# Patient Record
Sex: Male | Born: 1945 | Race: White | Hispanic: No | Marital: Married | Smoking: Former smoker
Health system: Southern US, Community
[De-identification: ages and names within clinical notes are randomized; demographics above are authoritative.]

## PROBLEM LIST (undated history)

## (undated) DIAGNOSIS — C439 Malignant melanoma of skin, unspecified: Secondary | ICD-10-CM

## (undated) HISTORY — PX: HERNIA REPAIR: SHX51

## (undated) HISTORY — PX: TONSILLECTOMY: SUR1361

## (undated) HISTORY — PX: OTHER SURGICAL HISTORY: SHX169

---

## 2008-02-22 ENCOUNTER — Emergency Department (HOSPITAL_BASED_OUTPATIENT_CLINIC_OR_DEPARTMENT_OTHER): Admission: EM | Admit: 2008-02-22 | Discharge: 2008-02-22 | Payer: Self-pay | Admitting: Emergency Medicine

## 2008-02-22 ENCOUNTER — Ambulatory Visit: Payer: Self-pay | Admitting: Diagnostic Radiology

## 2010-12-16 LAB — DIFFERENTIAL
Eosinophils Relative: 3 % (ref 0–5)
Lymphocytes Relative: 18 % (ref 12–46)
Lymphs Abs: 1.5 10*3/uL (ref 0.7–4.0)
Monocytes Absolute: 0.4 10*3/uL (ref 0.1–1.0)
Monocytes Relative: 5 % (ref 3–12)

## 2010-12-16 LAB — BASIC METABOLIC PANEL
GFR calc non Af Amer: >60 mL/min (ref >60)
Glucose, Bld: 102 mg/dL — ABNORMAL HIGH (ref 70–99)
Potassium: 4.5 mEq/L (ref 3.5–5.1)
Sodium: 142 mEq/L (ref 135–145)

## 2010-12-16 LAB — CBC
HCT: 42.2 % (ref 39.0–52.0)
Hemoglobin: 14.5 g/dL (ref 13.0–17.0)
RDW: 11.9 % (ref 11.5–15.5)
WBC: 8.3 10*3/uL (ref 4.0–10.5)

## 2015-02-21 ENCOUNTER — Emergency Department (HOSPITAL_BASED_OUTPATIENT_CLINIC_OR_DEPARTMENT_OTHER)
Admission: EM | Admit: 2015-02-21 | Discharge: 2015-02-21 | Disposition: A | Discharge status: Home / Self Care | Payer: Managed Care, Other (non HMO) | Attending: Emergency Medicine | Admitting: Emergency Medicine

## 2015-02-21 ENCOUNTER — Encounter (HOSPITAL_BASED_OUTPATIENT_CLINIC_OR_DEPARTMENT_OTHER): Payer: Self-pay | Admitting: *Deleted

## 2015-02-21 DIAGNOSIS — T65891A Toxic effect of other specified substances, accidental (unintentional), initial encounter: Secondary | ICD-10-CM | POA: Diagnosis not present

## 2015-02-21 DIAGNOSIS — Z7952 Long term (current) use of systemic steroids: Secondary | ICD-10-CM | POA: Diagnosis not present

## 2015-02-21 DIAGNOSIS — Z87891 Personal history of nicotine dependence: Secondary | ICD-10-CM | POA: Insufficient documentation

## 2015-02-21 DIAGNOSIS — R000 Tachycardia, unspecified: Secondary | ICD-10-CM | POA: Insufficient documentation

## 2015-02-21 DIAGNOSIS — Z85820 Personal history of malignant melanoma of skin: Secondary | ICD-10-CM | POA: Insufficient documentation

## 2015-02-21 DIAGNOSIS — R197 Diarrhea, unspecified: Secondary | ICD-10-CM | POA: Diagnosis present

## 2015-02-21 DIAGNOSIS — T451X5A Adverse effect of antineoplastic and immunosuppressive drugs, initial encounter: Secondary | ICD-10-CM | POA: Insufficient documentation

## 2015-02-21 DIAGNOSIS — R531 Weakness: Secondary | ICD-10-CM | POA: Insufficient documentation

## 2015-02-21 DIAGNOSIS — K521 Toxic gastroenteritis and colitis: Secondary | ICD-10-CM | POA: Diagnosis not present

## 2015-02-21 DIAGNOSIS — Y9389 Activity, other specified: Secondary | ICD-10-CM | POA: Insufficient documentation

## 2015-02-21 DIAGNOSIS — Y9289 Other specified places as the place of occurrence of the external cause: Secondary | ICD-10-CM | POA: Insufficient documentation

## 2015-02-21 DIAGNOSIS — Y998 Other external cause status: Secondary | ICD-10-CM | POA: Diagnosis not present

## 2015-02-21 DIAGNOSIS — X58XXXA Exposure to other specified factors, initial encounter: Secondary | ICD-10-CM | POA: Insufficient documentation

## 2015-02-21 HISTORY — DX: Malignant melanoma of skin, unspecified: C43.9

## 2015-02-21 LAB — BASIC METABOLIC PANEL
ANION GAP: 10 (ref 5–15)
BUN: 20 mg/dL (ref 6–20)
CALCIUM: 8.1 mg/dL — AB (ref 8.9–10.3)
CO2: 21 mmol/L — ABNORMAL LOW (ref 22–32)
Chloride: 101 mmol/L (ref 101–111)
Creatinine, Ser: 0.86 mg/dL (ref 0.61–1.24)
Glucose, Bld: 148 mg/dL — ABNORMAL HIGH (ref 65–99)
Potassium: 3 mmol/L — ABNORMAL LOW (ref 3.5–5.1)
Sodium: 132 mmol/L — ABNORMAL LOW (ref 135–145)

## 2015-02-21 MED ORDER — SODIUM CHLORIDE 0.9 % IV BOLUS (SEPSIS)
2000.0000 mL | Freq: Once | INTRAVENOUS | Status: AC
  Administered 2015-02-21: 2000 mL via INTRAVENOUS

## 2015-02-21 MED ORDER — POTASSIUM CHLORIDE CRYS ER 20 MEQ PO TBCR
20.0000 meq | EXTENDED_RELEASE_TABLET | Freq: Every day | ORAL | Status: AC
Start: 2015-02-21 — End: ?

## 2015-02-21 MED ORDER — POTASSIUM CHLORIDE CRYS ER 20 MEQ PO TBCR
40.0000 meq | EXTENDED_RELEASE_TABLET | Freq: Once | ORAL | Status: AC
  Administered 2015-02-21: 40 meq via ORAL

## 2015-02-21 MED ORDER — POTASSIUM CHLORIDE CRYS ER 20 MEQ PO TBCR
EXTENDED_RELEASE_TABLET | ORAL | Status: AC
  Filled 2015-02-21: qty 2

## 2015-02-21 NOTE — ED Notes (Signed)
MD at bedside. 

## 2015-02-21 NOTE — Discharge Instructions (Signed)
Make sure that you drink at least six eight ounce glasses of water each day. Avoid sugary drinks or foods. Your blood sugar was mildly elevated at 148. You may be borderline diabetic. Asked your primary care physician to order a test known as hemoglobin A1c to check you for diabetes. Prednisone can also cause your blood sugar to be high. Blood potassium was low today at 3.0. Take the potassium supplement prescribed. Ask your physician to recheck your blood potassium within a week. Return if you feel worse for any reason or see your doctor.

## 2015-02-21 NOTE — ED Provider Notes (Addendum)
CSN: [id number]     Arrival date & time 02/21/15  1007 History   First MD Initiated Contact with Patient 02/21/15 1017     Chief Complaint  Patient presents with  . Diarrhea     (Consider location/radiation/quality/duration/timing/severity/associated sxs/prior Treatment) HPI Complains of diarrhea, nonbloody onset approximately 2 weeks ago, since being on  agents ipilmumab and nivolumab as treatment for melanoma. Patient's oncologist has told him that diarrhea is a known side effect will last for several weeks. He has been treated with intravenous fluids twice in the past week. His oncologist told him to come to the emergency department to receive IV fluids however should receive no other medications including antidiarrheal agents. Patient denies fever. Other associated symptoms include generalized weakness and lightheadedness with standing, and thirst. He also admits to diminished appetite over the past several weeks. Denies nausea or vomiting No fever. No blood per rectum. No abdominal pain. Past Medical History  Diagnosis Date  . Melanoma [facility])    Past Surgical History  Procedure Laterality Date  . Tonsillectomy    . Excision for melanoma    . Hernia repair     No family history on file. Social History  Substance Use Topics  . Smoking status: Former Research scientist (life sciences)  . Smokeless tobacco: Never Used  . Alcohol Use: Yes     Comment: rare    Review of Systems  Constitutional: Positive for appetite change.  HENT: Negative.   Respiratory: Negative.   Cardiovascular: Negative.   Gastrointestinal: Positive for diarrhea.  Musculoskeletal: Negative.   Skin: Negative.   Allergic/Immunologic: Positive for immunocompromised state.       Cancer patient  Neurological: Positive for weakness.  Psychiatric/Behavioral: Negative.   All other systems reviewed and are negative.     Allergies  Review of patient's allergies indicates no known allergies.  Home Medications   Prior to Admission  medications   Medication Sig Start Date End Date Taking? Authorizing Provider  IPILIMUMAB IV Inject into the vein.   Yes Historical Provider, MD  NIVOLUMAB IV Inject into the vein.   Yes Historical Provider, MD  predniSONE (DELTASONE) 10 MG tablet Take 60 mg by mouth daily with breakfast.   Yes Historical Provider, MD   BP 122/81 mmHg  Pulse 123  Temp(Src) 97.8 F (36.6 C) (Oral)  Resp 18  Ht 5\' 6"  (1.676 m)  Wt 138 lb (62.596 kg)  BMI 22.28 kg/m2  SpO2 99% Physical Exam  Constitutional: He is oriented to person, place, and time. No distress.  Chronically ill-appearing alert and nontoxic  HENT:  Head: Normocephalic and atraumatic.  Mucous membranes dry  Eyes: Conjunctivae are normal. Pupils are equal, round, and reactive to light.  Neck: Neck supple. No tracheal deviation present. No thyromegaly present.  Cardiovascular: Normal rate.   No murmur heard. Tachycardic  Pulmonary/Chest: Effort normal and breath sounds normal.  Abdominal: Soft. Bowel sounds are normal. He exhibits no distension. There is no tenderness.  Musculoskeletal: Normal range of motion. He exhibits no edema or tenderness.  Neurological: He is alert and oriented to person, place, and time. No cranial nerve deficit. Coordination normal.  Skin: Skin is warm and dry. No rash noted.  Psychiatric: He has a normal mood and affect.  Nursing note and vitals reviewed.   ED Course  Procedures (including critical care time) Labs Review Labs Reviewed - No data to display  Imaging Review No results found. I have personally reviewed and evaluated these images and lab results as part of  my medical decision-making.   EKG Interpretation None     12:33pm patient feels much improved and ready to go home after treatment with intravenous fluids Results for orders placed or performed during the hospital encounter of [id number]  Basic metabolic panel  Result Value Ref Range   Sodium 132 (L) 135 - 145 mmol/L   Potassium  3.0 (L) 3.5 - 5.1 mmol/L   Chloride 101 101 - 111 mmol/L   CO2 21 (L) 22 - 32 mmol/L   Glucose, Bld 148 (H) 65 - 99 mg/dL   BUN 20 6 - 20 mg/dL   Creatinine, Ser 0.86 0.61 - 1.24 mg/dL   Calcium 8.1 (L) 8.9 - 10.3 mg/dL   GFR calc non Af Amer >60 >60 mL/min   GFR calc Af Amer >60 >60 mL/min   Anion gap 10 5 - 15   No results found.  MDM  . Diarrhea felt secondary to medications He will Receive potassium chloride 40 mEq orally prior to discharge prescription [facility]. He is encouraged to get his serum potassium rechecked within the next week . Suggest hemoglobin A1c Diagnoses #1 diarrhea #2 mild dehydration #3 hypokalemia #4 hyperglycemia Final diagnoses:  None        Orlie Dakin, MD 02/21/15 1237  Orlie Dakin, MD 02/21/15 1240

## 2015-02-21 NOTE — ED Notes (Signed)
Pt is on a study medication. Has had diarrhea for the last week and a half, worse for 3 days. Pt's physician advised him to come to the ED for IV fluids

## 2017-04-20 ENCOUNTER — Other Ambulatory Visit (HOSPITAL_COMMUNITY): Payer: Managed Care, Other (non HMO)

## 2017-04-20 ENCOUNTER — Inpatient Hospital Stay
Admission: AC | Admit: 2017-04-20 | Discharge: 2017-06-11 | Disposition: E | Payer: Managed Care, Other (non HMO) | Source: Other Acute Inpatient Hospital | Attending: Internal Medicine | Admitting: Internal Medicine

## 2017-04-20 DIAGNOSIS — J9690 Respiratory failure, unspecified, unspecified whether with hypoxia or hypercapnia: Secondary | ICD-10-CM

## 2017-04-20 DIAGNOSIS — Z931 Gastrostomy status: Secondary | ICD-10-CM

## 2017-04-20 DIAGNOSIS — J189 Pneumonia, unspecified organism: Secondary | ICD-10-CM

## 2017-04-20 DIAGNOSIS — Z431 Encounter for attention to gastrostomy: Secondary | ICD-10-CM

## 2017-04-20 DIAGNOSIS — R0603 Acute respiratory distress: Secondary | ICD-10-CM

## 2017-04-20 LAB — BLOOD GAS, ARTERIAL
Acid-Base Excess: 10.3 mmol/L — ABNORMAL HIGH (ref 0.0–2.0)
Bicarbonate: 33.7 mmol/L — ABNORMAL HIGH (ref 20.0–28.0)
FIO2: 30
MECHVT: 400 mL
O2 Saturation: 99.9 %
PATIENT TEMPERATURE: 98.6
PCO2 ART: 39.8 mmHg (ref 32.0–48.0)
PEEP: 5 cmH2O
PH ART: 7.537 — AB (ref 7.350–7.450)
RATE: 20 resp/min
pO2, Arterial: 157 mmHg — ABNORMAL HIGH (ref 83.0–108.0)

## 2017-04-20 MED ORDER — IOPAMIDOL (ISOVUE-300) INJECTION 61%
INTRAVENOUS | Status: AC
  Administered 2017-04-20: 30 mL via GASTROSTOMY
  Filled 2017-04-20: qty 50

## 2017-04-21 ENCOUNTER — Other Ambulatory Visit (HOSPITAL_COMMUNITY): Payer: Managed Care, Other (non HMO)

## 2017-04-21 LAB — CBC WITH DIFFERENTIAL/PLATELET
Basophils Absolute: 0 10*3/uL (ref 0.0–0.1)
Basophils Relative: 0 %
EOS PCT: 1 %
Eosinophils Absolute: 0.1 10*3/uL (ref 0.0–0.7)
HEMATOCRIT: 28 % — AB (ref 39.0–52.0)
Hemoglobin: 8.6 g/dL — ABNORMAL LOW (ref 13.0–17.0)
LYMPHS PCT: 20 %
Lymphs Abs: 1.9 10*3/uL (ref 0.7–4.0)
MCH: 28.3 pg (ref 26.0–34.0)
MCHC: 30.7 g/dL (ref 30.0–36.0)
MCV: 92.1 fL (ref 78.0–100.0)
MONO ABS: 0.9 10*3/uL (ref 0.1–1.0)
MONOS PCT: 10 %
NEUTROS ABS: 6.3 10*3/uL (ref 1.7–7.7)
Neutrophils Relative %: 69 %
Platelets: 436 10*3/uL — ABNORMAL HIGH (ref 150–400)
RBC: 3.04 MIL/uL — ABNORMAL LOW (ref 4.22–5.81)
RDW: 15 % (ref 11.5–15.5)
WBC: 9.3 10*3/uL (ref 4.0–10.5)

## 2017-04-21 LAB — BASIC METABOLIC PANEL
Anion gap: 10 (ref 5–15)
BUN: 17 mg/dL (ref 6–20)
CALCIUM: 8.9 mg/dL (ref 8.9–10.3)
CO2: 32 mmol/L (ref 22–32)
Chloride: 93 mmol/L — ABNORMAL LOW (ref 101–111)
Creatinine, Ser: 0.36 mg/dL — ABNORMAL LOW (ref 0.61–1.24)
GFR calc Af Amer: >60 mL/min (ref >60)
GFR calc non Af Amer: >60 mL/min (ref >60)
GLUCOSE: 132 mg/dL — AB (ref 65–99)
POTASSIUM: 3.7 mmol/L (ref 3.5–5.1)
Sodium: 135 mmol/L (ref 135–145)

## 2017-04-23 MED ORDER — VANCOMYCIN HCL 50 MG/ML PO SOLR
125.00 | ORAL | Status: DC
Start: 2017-04-21 — End: 2017-04-23

## 2017-04-23 MED ORDER — ACETAMINOPHEN 325 MG PO TABS
650.00 | ORAL_TABLET | ORAL | Status: DC
Start: ? — End: 2017-04-23

## 2017-04-23 MED ORDER — POTASSIUM CHLORIDE 20 MEQ/15ML (10%) PO SOLN
ORAL | Status: DC
Start: 2017-04-21 — End: 2017-04-23

## 2017-04-23 MED ORDER — GENERIC EXTERNAL MEDICATION
2.00 g | Status: DC
Start: 2017-04-21 — End: 2017-04-23

## 2017-04-23 MED ORDER — DOCUSATE SODIUM 100 MG PO CAPS
100.00 | ORAL_CAPSULE | ORAL | Status: DC
Start: ? — End: 2017-04-23

## 2017-04-23 MED ORDER — ONDANSETRON 4 MG PO TBDP
4.00 | ORAL_TABLET | ORAL | Status: DC
Start: ? — End: 2017-04-23

## 2017-04-23 MED ORDER — GENERIC EXTERNAL MEDICATION
1.00 | Status: DC
Start: ? — End: 2017-04-23

## 2017-04-23 MED ORDER — DIPHENHYDRAMINE HCL 50 MG PO CAPS
50.00 | ORAL_CAPSULE | ORAL | Status: DC
Start: ? — End: 2017-04-23

## 2017-04-23 MED ORDER — GENERIC EXTERNAL MEDICATION
1.00 | Status: DC
Start: 2017-04-21 — End: 2017-04-23

## 2017-04-23 MED ORDER — CHOLECALCIFEROL 25 MCG (1000 UT) PO TABS
1000.00 | ORAL_TABLET | ORAL | Status: DC
Start: 2017-04-21 — End: 2017-04-23

## 2017-04-23 MED ORDER — IBUPROFEN 100 MG/5ML PO SUSP
400.00 | ORAL | Status: DC
Start: 2017-04-21 — End: 2017-04-23

## 2017-04-23 MED ORDER — HYDRALAZINE HCL 20 MG/ML IJ SOLN
10.00 | INTRAMUSCULAR | Status: DC
Start: ? — End: 2017-04-23

## 2017-04-23 MED ORDER — ACETYLCYSTEINE 20 % IN SOLN
3.00 | RESPIRATORY_TRACT | Status: DC
Start: ? — End: 2017-04-23

## 2017-04-23 MED ORDER — HYDROCODONE-ACETAMINOPHEN 5-325 MG PO TABS
1.00 | ORAL_TABLET | ORAL | Status: DC
Start: ? — End: 2017-04-23

## 2017-04-23 MED ORDER — FENTANYL 25 MCG/HR TD PT72
1.00 | MEDICATED_PATCH | TRANSDERMAL | Status: DC
Start: 2017-04-21 — End: 2017-04-23

## 2017-04-23 MED ORDER — FUROSEMIDE 20 MG PO TABS
20.00 | ORAL_TABLET | ORAL | Status: DC
Start: ? — End: 2017-04-23

## 2017-04-23 MED ORDER — INSULIN LISPRO 100 UNIT/ML ~~LOC~~ SOLN
1.00 | SUBCUTANEOUS | Status: DC
Start: 2017-04-21 — End: 2017-04-23

## 2017-04-23 MED ORDER — GENERIC EXTERNAL MEDICATION
4.00 | Status: DC
Start: 2017-04-21 — End: 2017-04-23

## 2017-04-23 MED ORDER — ENOXAPARIN SODIUM 40 MG/0.4ML ~~LOC~~ SOLN
40.00 | SUBCUTANEOUS | Status: DC
Start: 2017-04-21 — End: 2017-04-23

## 2017-04-23 MED ORDER — SERTRALINE HCL 100 MG PO TABS
100.00 | ORAL_TABLET | ORAL | Status: DC
Start: 2017-04-21 — End: 2017-04-23

## 2017-04-23 MED ORDER — FAMOTIDINE 20 MG PO TABS
40.00 | ORAL_TABLET | ORAL | Status: DC
Start: 2017-04-21 — End: 2017-04-23

## 2017-04-23 MED ORDER — GUAIFENESIN 100 MG/5ML PO SYRP
200.00 | ORAL_SOLUTION | ORAL | Status: DC
Start: ? — End: 2017-04-23

## 2017-04-23 MED ORDER — FERROUS SULFATE 300 (60 FE) MG/5ML PO SYRP
300.00 | ORAL_SOLUTION | ORAL | Status: DC
Start: 2017-04-21 — End: 2017-04-23

## 2017-04-23 MED ORDER — QUETIAPINE FUMARATE 50 MG PO TABS
50.00 | ORAL_TABLET | ORAL | Status: DC
Start: 2017-04-21 — End: 2017-04-23

## 2017-04-23 MED ORDER — DEXTROSE 50 % IV SOLN
12.00 g | INTRAVENOUS | Status: DC
Start: ? — End: 2017-04-23

## 2017-04-23 MED ORDER — ALUMINUM-MAGNESIUM-SIMETHICONE 200-200-20 MG/5ML PO SUSP
30.00 | ORAL | Status: DC
Start: ? — End: 2017-04-23

## 2017-04-23 MED ORDER — GENERIC EXTERNAL MEDICATION
2.00 | Status: DC
Start: 2017-04-21 — End: 2017-04-23

## 2017-04-23 MED ORDER — GLUCOSE 40 % PO GEL
15.00 g | ORAL | Status: DC
Start: ? — End: 2017-04-23

## 2017-04-23 MED ORDER — ALBUTEROL SULFATE (2.5 MG/3ML) 0.083% IN NEBU
2.50 | INHALATION_SOLUTION | RESPIRATORY_TRACT | Status: DC
Start: ? — End: 2017-04-23

## 2017-04-25 LAB — BASIC METABOLIC PANEL
ANION GAP: 10 (ref 5–15)
BUN: 30 mg/dL — ABNORMAL HIGH (ref 6–20)
CALCIUM: 9.1 mg/dL (ref 8.9–10.3)
CO2: 33 mmol/L — ABNORMAL HIGH (ref 22–32)
Chloride: 97 mmol/L — ABNORMAL LOW (ref 101–111)
Creatinine, Ser: 0.44 mg/dL — ABNORMAL LOW (ref 0.61–1.24)
GFR calc non Af Amer: >60 mL/min (ref >60)
Glucose, Bld: 133 mg/dL — ABNORMAL HIGH (ref 65–99)
POTASSIUM: 4 mmol/L (ref 3.5–5.1)
Sodium: 140 mmol/L (ref 135–145)

## 2017-04-25 LAB — CBC WITH DIFFERENTIAL/PLATELET
Basophils Absolute: 0 10*3/uL (ref 0.0–0.1)
Basophils Relative: 0 %
Eosinophils Absolute: 0.1 10*3/uL (ref 0.0–0.7)
Eosinophils Relative: 1 %
HCT: 26.5 % — ABNORMAL LOW (ref 39.0–52.0)
Hemoglobin: 8.2 g/dL — ABNORMAL LOW (ref 13.0–17.0)
Lymphocytes Relative: 19 %
Lymphs Abs: 2 10*3/uL (ref 0.7–4.0)
MCH: 29 pg (ref 26.0–34.0)
MCHC: 30.9 g/dL (ref 30.0–36.0)
MCV: 93.6 fL (ref 78.0–100.0)
Monocytes Absolute: 0.9 10*3/uL (ref 0.1–1.0)
Monocytes Relative: 9 %
Neutro Abs: 7.5 10*3/uL (ref 1.7–7.7)
Neutrophils Relative %: 71 %
Platelets: 423 10*3/uL — ABNORMAL HIGH (ref 150–400)
RBC: 2.83 MIL/uL — ABNORMAL LOW (ref 4.22–5.81)
RDW: 14.9 % (ref 11.5–15.5)
WBC: 10.5 10*3/uL (ref 4.0–10.5)

## 2017-05-01 ENCOUNTER — Other Ambulatory Visit (HOSPITAL_COMMUNITY): Payer: Managed Care, Other (non HMO)

## 2017-05-01 LAB — BASIC METABOLIC PANEL
Anion gap: 15 (ref 5–15)
BUN: 37 mg/dL — ABNORMAL HIGH (ref 6–20)
CHLORIDE: 109 mmol/L (ref 101–111)
CO2: 26 mmol/L (ref 22–32)
CREATININE: 0.49 mg/dL — AB (ref 0.61–1.24)
Calcium: 9.7 mg/dL (ref 8.9–10.3)
GFR calc non Af Amer: >60 mL/min (ref >60)
GLUCOSE: 177 mg/dL — AB (ref 65–99)
Potassium: 4.1 mmol/L (ref 3.5–5.1)
Sodium: 150 mmol/L — ABNORMAL HIGH (ref 135–145)

## 2017-05-01 LAB — CBC
HCT: 32.5 % — ABNORMAL LOW (ref 39.0–52.0)
Hemoglobin: 9.5 g/dL — ABNORMAL LOW (ref 13.0–17.0)
MCH: 27.2 pg (ref 26.0–34.0)
MCHC: 29.2 g/dL — AB (ref 30.0–36.0)
MCV: 93.1 fL (ref 78.0–100.0)
PLATELETS: 520 10*3/uL — AB (ref 150–400)
RBC: 3.49 MIL/uL — AB (ref 4.22–5.81)
RDW: 14.7 % (ref 11.5–15.5)
WBC: 18.6 10*3/uL — ABNORMAL HIGH (ref 4.0–10.5)

## 2017-05-01 LAB — D-DIMER, QUANTITATIVE: D-Dimer, Quant: 1.04 ug/mL-FEU — ABNORMAL HIGH (ref 0.00–0.50)

## 2017-05-02 LAB — URINALYSIS, ROUTINE W REFLEX MICROSCOPIC
BILIRUBIN URINE: NEGATIVE
GLUCOSE, UA: NEGATIVE mg/dL
HGB URINE DIPSTICK: NEGATIVE
KETONES UR: NEGATIVE mg/dL
LEUKOCYTES UA: NEGATIVE
NITRITE: NEGATIVE
PROTEIN: 30 mg/dL — AB
Specific Gravity, Urine: 1.016 (ref 1.005–1.030)
pH: 8 (ref 5.0–8.0)

## 2017-05-03 LAB — URINE CULTURE: Culture: NO GROWTH

## 2017-05-04 ENCOUNTER — Other Ambulatory Visit (HOSPITAL_COMMUNITY): Payer: Managed Care, Other (non HMO)

## 2017-05-04 LAB — BASIC METABOLIC PANEL
Anion gap: 10 (ref 5–15)
BUN: 21 mg/dL — AB (ref 6–20)
CALCIUM: 9 mg/dL (ref 8.9–10.3)
CO2: 26 mmol/L (ref 22–32)
CREATININE: 0.37 mg/dL — AB (ref 0.61–1.24)
Chloride: 106 mmol/L (ref 101–111)
GFR calc non Af Amer: >60 mL/min (ref >60)
Glucose, Bld: 171 mg/dL — ABNORMAL HIGH (ref 65–99)
Potassium: 3.8 mmol/L (ref 3.5–5.1)
SODIUM: 142 mmol/L (ref 135–145)

## 2017-05-07 LAB — C DIFFICILE QUICK SCREEN W PCR REFLEX
C DIFFICILE (CDIFF) TOXIN: POSITIVE — AB
C DIFFICLE (CDIFF) ANTIGEN: POSITIVE — AB
C Diff interpretation: DETECTED

## 2017-05-09 LAB — BASIC METABOLIC PANEL
ANION GAP: 10 (ref 5–15)
BUN: 13 mg/dL (ref 6–20)
CALCIUM: 8.7 mg/dL — AB (ref 8.9–10.3)
CO2: 25 mmol/L (ref 22–32)
Chloride: 100 mmol/L — ABNORMAL LOW (ref 101–111)
Creatinine, Ser: 0.4 mg/dL — ABNORMAL LOW (ref 0.61–1.24)
GFR calc non Af Amer: >60 mL/min (ref >60)
Glucose, Bld: 89 mg/dL (ref 65–99)
Potassium: 4.1 mmol/L (ref 3.5–5.1)
SODIUM: 135 mmol/L (ref 135–145)

## 2017-05-11 DEATH — deceased

## 2017-05-12 ENCOUNTER — Other Ambulatory Visit (HOSPITAL_COMMUNITY): Payer: Managed Care, Other (non HMO)

## 2017-05-12 ENCOUNTER — Encounter: Payer: Self-pay | Admitting: Radiology

## 2017-05-12 MED ORDER — IOPAMIDOL (ISOVUE-300) INJECTION 61%
INTRAVENOUS | Status: AC
  Administered 2017-05-12: 100 mL
  Filled 2017-05-12: qty 100

## 2017-05-14 LAB — CBC
HEMATOCRIT: 28.5 % — AB (ref 39.0–52.0)
HEMOGLOBIN: 8.9 g/dL — AB (ref 13.0–17.0)
MCH: 26.9 pg (ref 26.0–34.0)
MCHC: 31.2 g/dL (ref 30.0–36.0)
MCV: 86.1 fL (ref 78.0–100.0)
Platelets: 353 10*3/uL (ref 150–400)
RBC: 3.31 MIL/uL — ABNORMAL LOW (ref 4.22–5.81)
RDW: 14.7 % (ref 11.5–15.5)
WBC: 13.6 10*3/uL — ABNORMAL HIGH (ref 4.0–10.5)

## 2017-05-14 LAB — BASIC METABOLIC PANEL
Anion gap: 12 (ref 5–15)
BUN: 17 mg/dL (ref 6–20)
CALCIUM: 8.9 mg/dL (ref 8.9–10.3)
CHLORIDE: 96 mmol/L — AB (ref 101–111)
CO2: 26 mmol/L (ref 22–32)
CREATININE: 0.36 mg/dL — AB (ref 0.61–1.24)
GFR calc Af Amer: >60 mL/min (ref >60)
GFR calc non Af Amer: >60 mL/min (ref >60)
GLUCOSE: 121 mg/dL — AB (ref 65–99)
Potassium: 4.3 mmol/L (ref 3.5–5.1)
Sodium: 134 mmol/L — ABNORMAL LOW (ref 135–145)

## 2017-05-14 LAB — VANCOMYCIN, TROUGH: Vancomycin Tr: 7 ug/mL — ABNORMAL LOW (ref 15–20)

## 2017-05-15 LAB — CULTURE, RESPIRATORY W GRAM STAIN

## 2017-05-15 LAB — CULTURE, RESPIRATORY

## 2017-05-16 LAB — VANCOMYCIN, TROUGH

## 2017-05-20 LAB — BASIC METABOLIC PANEL
Anion gap: 10 (ref 5–15)
BUN: 16 mg/dL (ref 6–20)
CALCIUM: 9 mg/dL (ref 8.9–10.3)
CHLORIDE: 96 mmol/L — AB (ref 101–111)
CO2: 29 mmol/L (ref 22–32)
CREATININE: 0.36 mg/dL — AB (ref 0.61–1.24)
Glucose, Bld: 128 mg/dL — ABNORMAL HIGH (ref 65–99)
Potassium: 4.1 mmol/L (ref 3.5–5.1)
SODIUM: 135 mmol/L (ref 135–145)

## 2017-05-20 LAB — BLOOD GAS, ARTERIAL
Acid-Base Excess: 9 mmol/L — ABNORMAL HIGH (ref 0.0–2.0)
BICARBONATE: 32.9 mmol/L — AB (ref 20.0–28.0)
FIO2: 40
O2 Saturation: 98.8 %
PATIENT TEMPERATURE: 98.6
PEEP: 5 cmH2O
RATE: 20 resp/min
VT: 400 mL
pCO2 arterial: 44.8 mmHg (ref 32.0–48.0)
pH, Arterial: 7.479 — ABNORMAL HIGH (ref 7.350–7.450)
pO2, Arterial: 113 mmHg — ABNORMAL HIGH (ref 83.0–108.0)

## 2017-05-20 LAB — CBC
HCT: 25.2 % — ABNORMAL LOW (ref 39.0–52.0)
HEMOGLOBIN: 8 g/dL — AB (ref 13.0–17.0)
MCH: 27.6 pg (ref 26.0–34.0)
MCHC: 31.7 g/dL (ref 30.0–36.0)
MCV: 86.9 fL (ref 78.0–100.0)
PLATELETS: 350 10*3/uL (ref 150–400)
RBC: 2.9 MIL/uL — ABNORMAL LOW (ref 4.22–5.81)
RDW: 14.6 % (ref 11.5–15.5)
WBC: 9.2 10*3/uL (ref 4.0–10.5)

## 2017-05-22 ENCOUNTER — Other Ambulatory Visit (HOSPITAL_COMMUNITY): Payer: Managed Care, Other (non HMO)

## 2017-06-11 DEATH — deceased

## 2018-07-31 IMAGING — CT CT ABD-PELV W/ CM
2 of 5 series · 13 of 46 positions shown, 15 images · IV contrast (Omni 300)
Comparison: Chest CT 03/13/2017.

CLINICAL DATA: Patient with history of melanoma. Assess treatment
response.

EXAM:
CT CHEST, ABDOMEN, AND PELVIS WITH CONTRAST
TECHNIQUE: Multidetector CT imaging of the chest, abdomen and pelvis was
performed following the standard protocol during bolus
administration of intravenous contrast.
CONTRAST:  100mL [id number] IOPAMIDOL ([id number]) INJECTION 61%

[Series 3: cap with 5mm st · axial · 0.62mm/px · z∈[+826,+1366]mm · 10 of 127 slices shown, 12 images]
[im 10/127  soft-tissue]
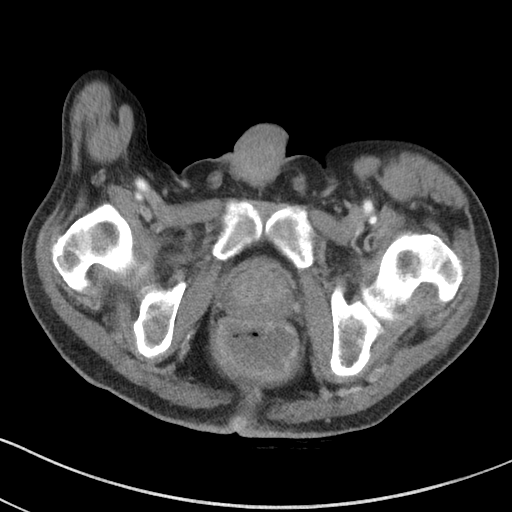
[im 10/127  bone]
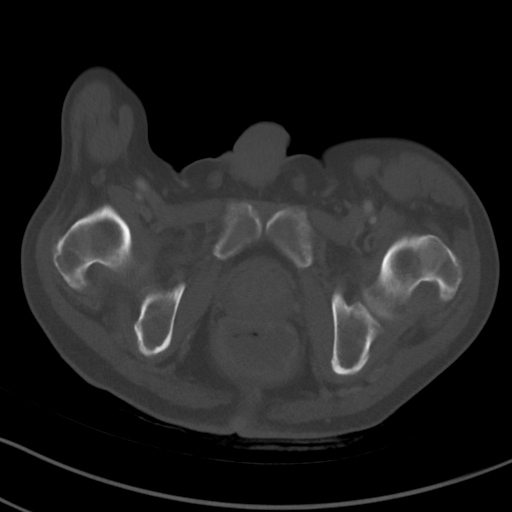
[im 19/127  soft-tissue]
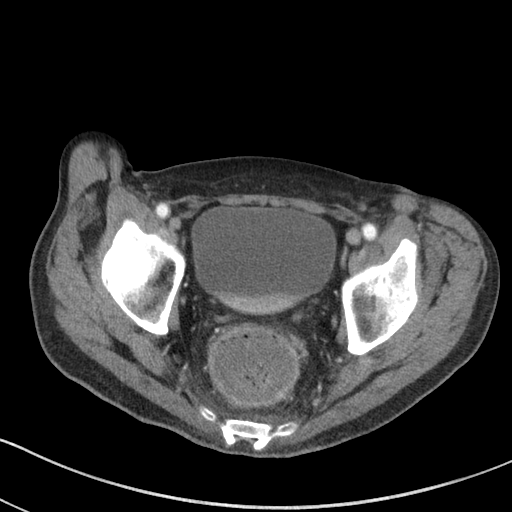
[im 37/127  soft-tissue]
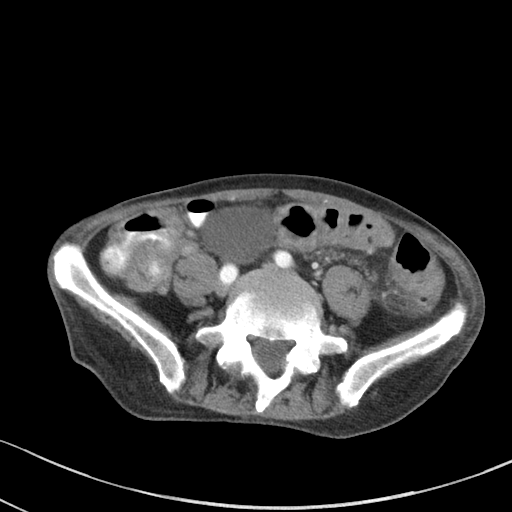
[im 46/127  soft-tissue]
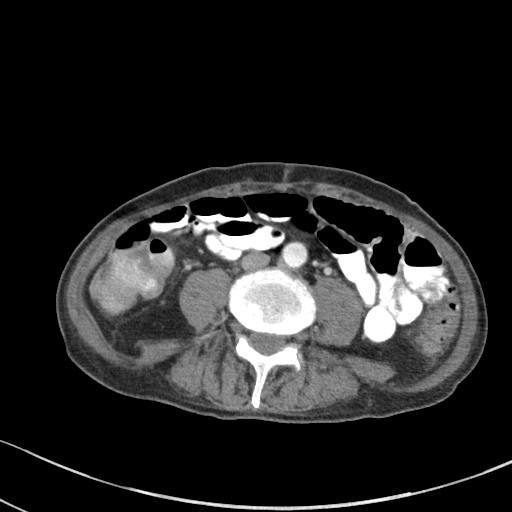
[im 55/127  soft-tissue]
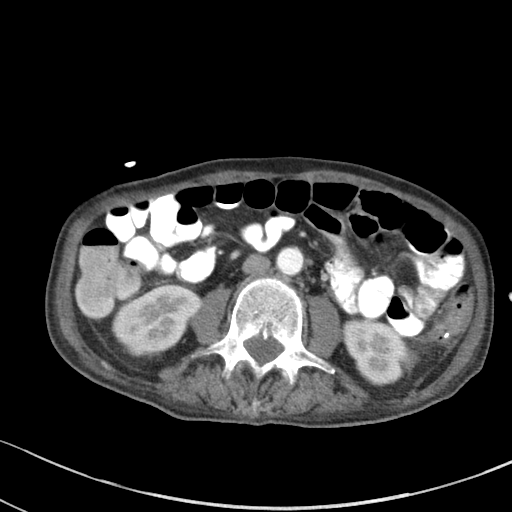
[im 73/127  soft-tissue]
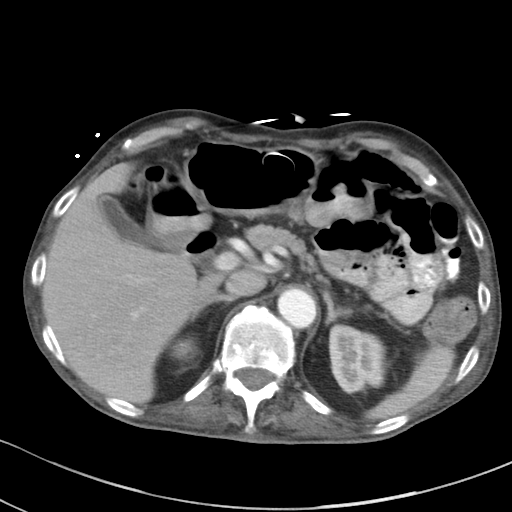
[im 82/127  soft-tissue]
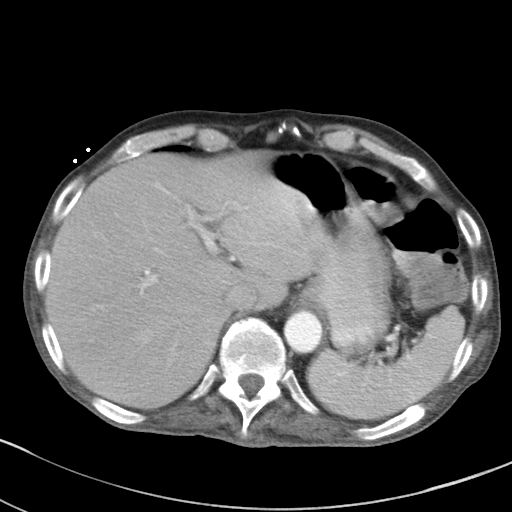
[im 91/127  soft-tissue]
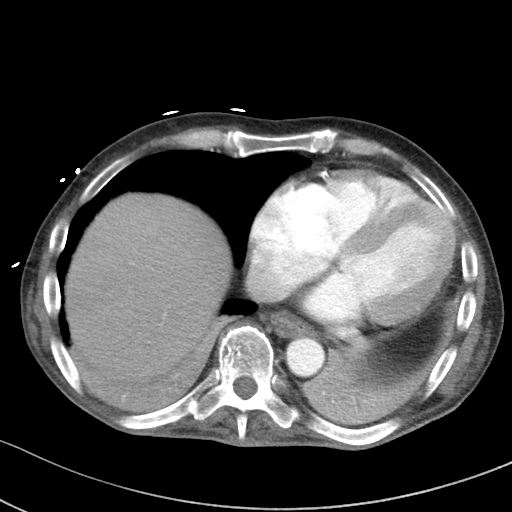
[im 109/127  soft-tissue]
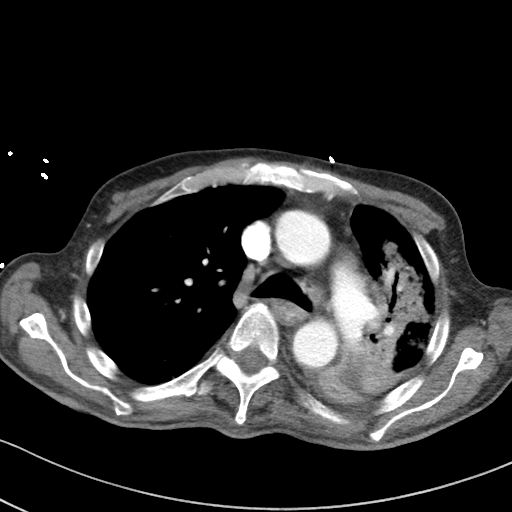
[im 109/127  bone]
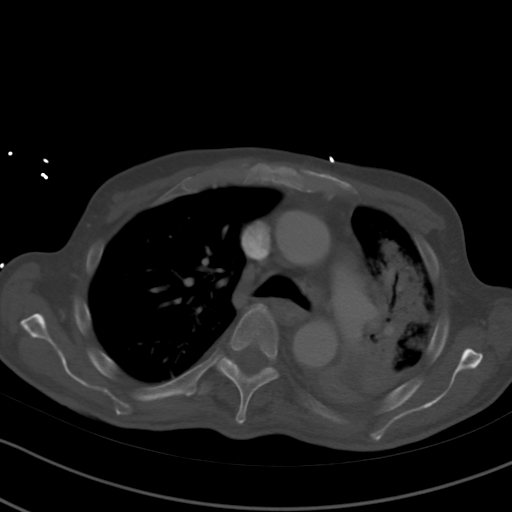
[im 118/127  soft-tissue]
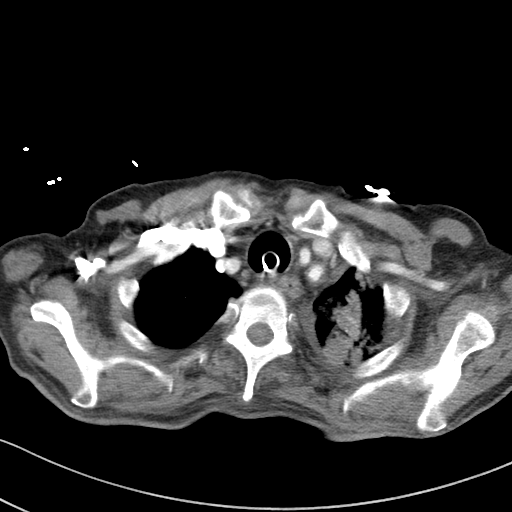

[Series 5: cap with 3mm st cor · coronal · 0.62mm/px · 3 of 150 slices shown]
[im 50/150  soft-tissue]
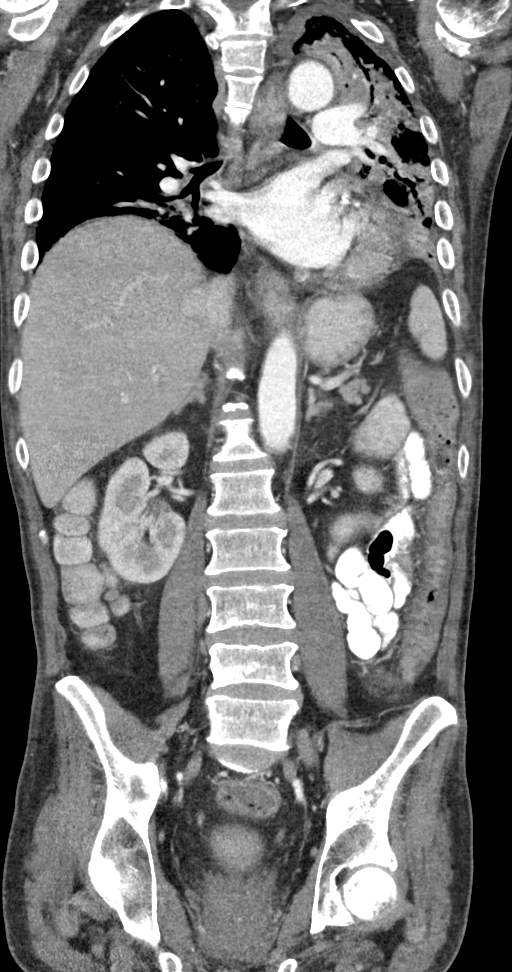
[im 67/150  soft-tissue]
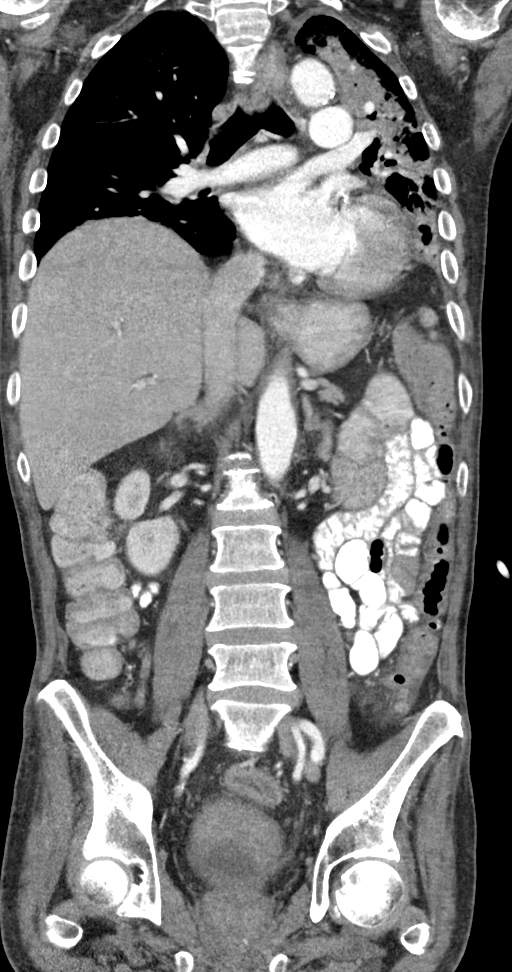
[im 83/150  soft-tissue]
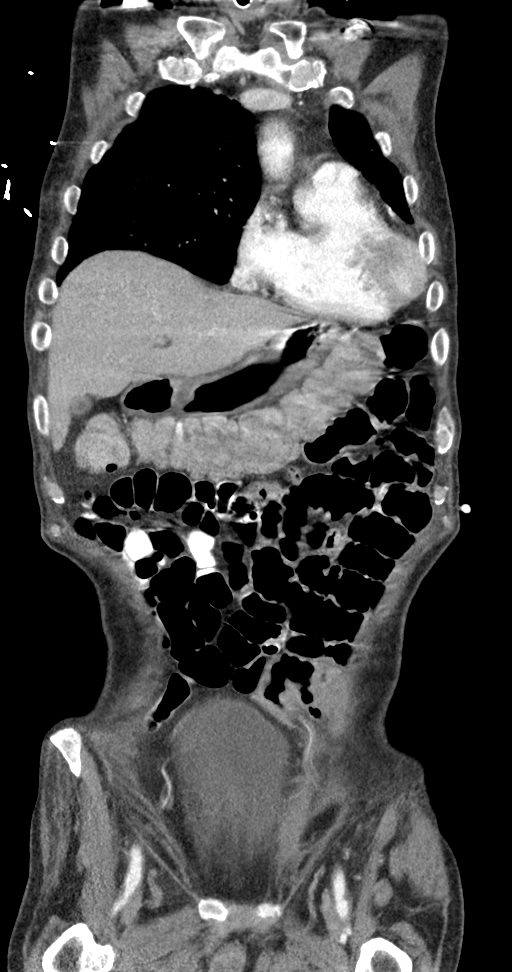

[13 of 46 positions shown; findings below may reference images not displayed]

FINDINGS: CT CHEST FINDINGS

Cardiovascular: Heart is mildly enlarged. No pericardial effusion.
Thoracic aortic vascular calcifications.

Mediastinum/Nodes: No enlarged axillary, mediastinal or hilar
lymphadenopathy. Wall thickening of the esophagus.

Lungs/Pleura: Tracheostomy tube is demonstrated. Fluid within the
left lower lobe bronchi. Re demonstrated consolidation involving the
left lower lobe and lingula. Interval development of patchy
consolidation within the left upper lobe. Small left pleural
effusion. Re demonstrated consolidation within the subpleural right
lower lobe. Slight interval improvement in previously described
nodularity within the right middle and right lower lobes.

Musculoskeletal: Healing posterior left eleventh rib fracture with
callus formation.

CT ABDOMEN PELVIS FINDINGS

Hepatobiliary: Liver is normal in size and contour. No focal hepatic
lesion is identified. Gallbladder is unremarkable. No intrahepatic
or extrahepatic biliary ductal dilatation.

Pancreas: Unremarkable

Spleen: Unremarkable

Adrenals/Urinary Tract: Normal adrenal glands. Kidneys enhance
symmetrically with contrast. No hydronephrosis. Layering high
attenuation material within the urinary bladder.

Stomach/Bowel: There is wall thickening and surrounding fat
stranding about the descending and sigmoid colon and rectum.
Percutaneous gastrostomy tube within the stomach. No evidence for
small bowel obstruction. Normal appendix. No free fluid or free
intraperitoneal air.

Vascular/Lymphatic: Normal caliber abdominal aorta. Atherosclerotic
narrowing of the proximal aspect of the superior mesenteric artery.

Reproductive: Prostate enlarged.

Other: None.

Musculoskeletal: Lumbar spine degenerative changes. No aggressive or
acute appearing osseous lesions.
IMPRESSION: 1. Interval development of consolidation within the left upper lobe
with persistent lingular, right lower lobe and left lower lobe
consolidation concerning for multifocal pneumonia and associated
atelectasis.
2. Small left pleural effusion.
3. Wall thickening of the descending / sigmoid colon and rectum with
surrounding fat stranding most compatible with colitis.
4. Wall thickening of the esophagus most compatible with
esophagitis.
5. Interval improvement in previously described right middle and
right lower lobe nodularity likely improving infectious/inflammatory
process.
